# Patient Record
Sex: Male | Born: 1950 | Race: White | Hispanic: No | Marital: Married
Health system: Southern US, Community
[De-identification: ages and names within clinical notes are randomized; demographics above are authoritative.]

---

## 1998-08-03 ENCOUNTER — Ambulatory Visit (HOSPITAL_BASED_OUTPATIENT_CLINIC_OR_DEPARTMENT_OTHER): Admission: RE | Admit: 1998-08-03 | Discharge: 1998-08-03 | Payer: Self-pay | Admitting: Surgery

## 2000-11-20 ENCOUNTER — Ambulatory Visit (HOSPITAL_COMMUNITY): Admission: RE | Admit: 2000-11-20 | Discharge: 2000-11-20 | Payer: Self-pay | Admitting: Gastroenterology

## 2001-10-01 ENCOUNTER — Encounter (INDEPENDENT_AMBULATORY_CARE_PROVIDER_SITE_OTHER): Payer: Self-pay | Admitting: Specialist

## 2001-10-01 ENCOUNTER — Ambulatory Visit (HOSPITAL_BASED_OUTPATIENT_CLINIC_OR_DEPARTMENT_OTHER): Admission: RE | Admit: 2001-10-01 | Discharge: 2001-10-02 | Payer: Self-pay | Admitting: Surgery

## 2009-03-11 ENCOUNTER — Emergency Department (HOSPITAL_BASED_OUTPATIENT_CLINIC_OR_DEPARTMENT_OTHER): Admission: EM | Admit: 2009-03-11 | Discharge: 2009-03-11 | Payer: Self-pay | Admitting: Emergency Medicine

## 2009-03-11 ENCOUNTER — Ambulatory Visit: Payer: Self-pay | Admitting: Diagnostic Radiology

## 2010-04-05 LAB — CBC
Hemoglobin: 14 g/dL (ref 13.0–17.0)
MCHC: 34.8 g/dL (ref 30.0–36.0)
RDW: 13.2 % (ref 11.5–15.5)
WBC: 15 10*3/uL — ABNORMAL HIGH (ref 4.0–10.5)

## 2010-04-05 LAB — DIFFERENTIAL
Eosinophils Absolute: 0.1 10*3/uL (ref 0.0–0.7)
Eosinophils Relative: 1 % (ref 0–5)
Monocytes Absolute: 1.6 10*3/uL — ABNORMAL HIGH (ref 0.1–1.0)
Neutro Abs: 12 10*3/uL — ABNORMAL HIGH (ref 1.7–7.7)
Neutrophils Relative %: 80 % — ABNORMAL HIGH (ref 43–77)

## 2010-04-05 LAB — BASIC METABOLIC PANEL
BUN: 6 mg/dL (ref 6–23)
CO2: 26 mEq/L (ref 19–32)
Calcium: 8.9 mg/dL (ref 8.4–10.5)

## 2010-05-28 NOTE — Procedures (Signed)
[facility]  Patient:    Tyler Lynch, Tyler Lynch Visit Number: [id number] MRN: [id number]          Service Type: END Location: ENDO Attending Physician:  Louie Bun Dictated by:   Everardo All Madilyn Fireman, M.D. Proc. Date: 11/20/00 Admit Date:  11/20/2000   CC:         Jethro Bastos, M.D.   Procedure Report  PROCEDURE PERFORMED:  Colonoscopy  INDICATION FOR PROCEDURE:  Intermittent rectal bleeding in a 60 year old patient with no prior colon screening.  DESCRIPTION OF PROCEDURE:  The patient was placed in the left lateral decubitus position and placed on the pulse monitor with continuous low flow oxygen delivered by nasal cannula.  He was sedated with 100 mg IV Demerol and 10 mg IV Versed.  The Olympus video colonoscope was inserted into the rectum and advanced to the cecum.  Confirmed by transillumination of McBurneys point and visualization of the ileocecal valve and appendiceal orifice.  The prep was good.  The cecum, ascending, transverse, descending and sigmoid colon all appeared normal with no masses, polyps, diverticula or other mucosal abnormalities.  The rectum likewise appeared normal on retroflexed view.  The anus did reveal some small internal hemorrhoids.  The colonoscope was then withdrawn and the patient returned to the recovery room in stable condition. He tolerated the procedure well and there were no immediate complications.  IMPRESSION: Internal hemorrhoids, otherwise normal colonoscopy. Dictated by:   Everardo All Madilyn Fireman, M.D. Attending Physician:  Louie Bun DD:  11/20/00 TD:  11/21/00 Job: [id number]

## 2010-05-28 NOTE — Op Note (Signed)
NAME:  Tyler Lynch, Tyler Lynch                          ACCOUNT NO.:  [id number]   MEDICAL RECORD NO.:  [id number]                   PATIENT TYPE:  AMB   LOCATION:  DSC                                  FACILITY:  [facility]   PHYSICIAN:  Thornton Park. Daphine Deutscher, M.D.             DATE OF BIRTH:  1950-02-15   DATE OF PROCEDURE:  10/01/2001  DATE OF DISCHARGE:                                 OPERATIVE REPORT   PREOPERATIVE DIAGNOSES:  Recurrent left inguinal hernia.   POSTOPERATIVE DIAGNOSES:  Left indirect inguinal hernia.   OPERATION PERFORMED:   SURGEON:  Matthew B. Daphine Deutscher, M.D.   ASSISTANT:  Sandria Bales. Ezzard Standing, M.D.   ANESTHESIA:  General endotracheal.   DESCRIPTION OF PROCEDURE:  The patient was taken to the operating room 8 and  given general anesthesia.  The abdomen was prepped with Betadine and draped  sterilely.  An oblique incision was made in the left inguinal region and  carried down through the subcutaneous tissues to the external oblique.  This  was identified and incised along the fibers.  I mobilized the cord  structures and proximally found a very large prominent indirect inguinal  hernia that occurred through a dilated ring mainly medial to the cord.  I  could see one of the tacks used to tack the mesh anteriorly.  I went ahead  and opened up the sac and teased it away from the cord structures and did a  high ligation using running 2-0 Prolene.   I then tucked that back down into the abdomen and grasped an edge of the  mesh and sutured it inferiorly to tighten the ring.  I put a second piece to  ensure that this was snug but not too tight.  I then cut a piece of mesh to  fit the floor and sutured it along the inguinal ligament and medially to the  internal oblique musculature.  Laterally, I allowed these to overlap and  then sutured them together with horizontal mattress sutures of 2-0 Prolene  which completely reinforced the internal ring and I felt it afforded a good  strong  closure.  I did however, allow adequate room for the cord structures.   This area was irrigated.  The external oblique was closed with running 2-0  Vicryl.  4-0 Vicryl was used in the subcutaneous tissues and then the skin  was closed with interrupted 5-0 Vicryl with benzoin and Steri-Strips.  The  patient tolerated the procedure well.  He will be kept at the [facility] for overnight observation.                                                 Thornton Park Daphine Deutscher, M.D.    MBM/MEDQ  D:  10/01/2001  T:  10/02/2001  Job:  [id number]   cc:   Jethro Bastos, M.D.  [address]  [place]  [place] [address]  Fax: [phone]

## 2011-07-11 IMAGING — CR DG HAND COMPLETE 3+V*L*
3 series · 3 of 3 positions shown · non-contrast
Comparison: None.

CLINICAL DATA: Red  streaks on arm.  Cat bite.

LEFT HAND - COMPLETE 3+ VIEW

[x hand pa left]
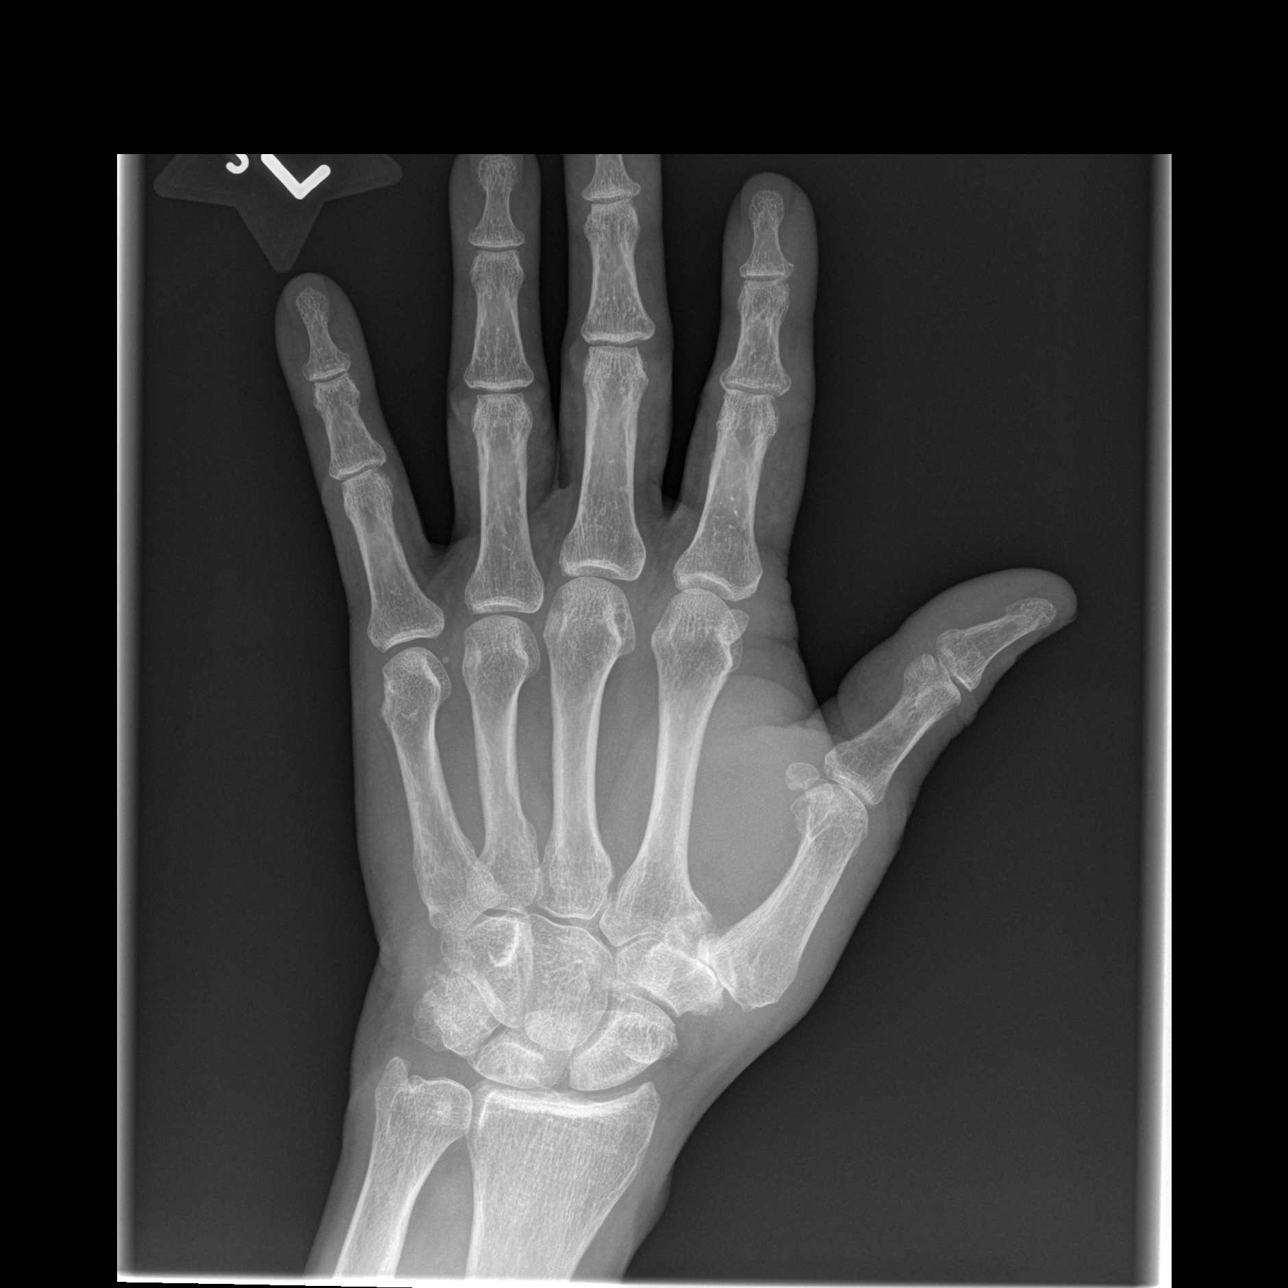

[x hand oblique left]
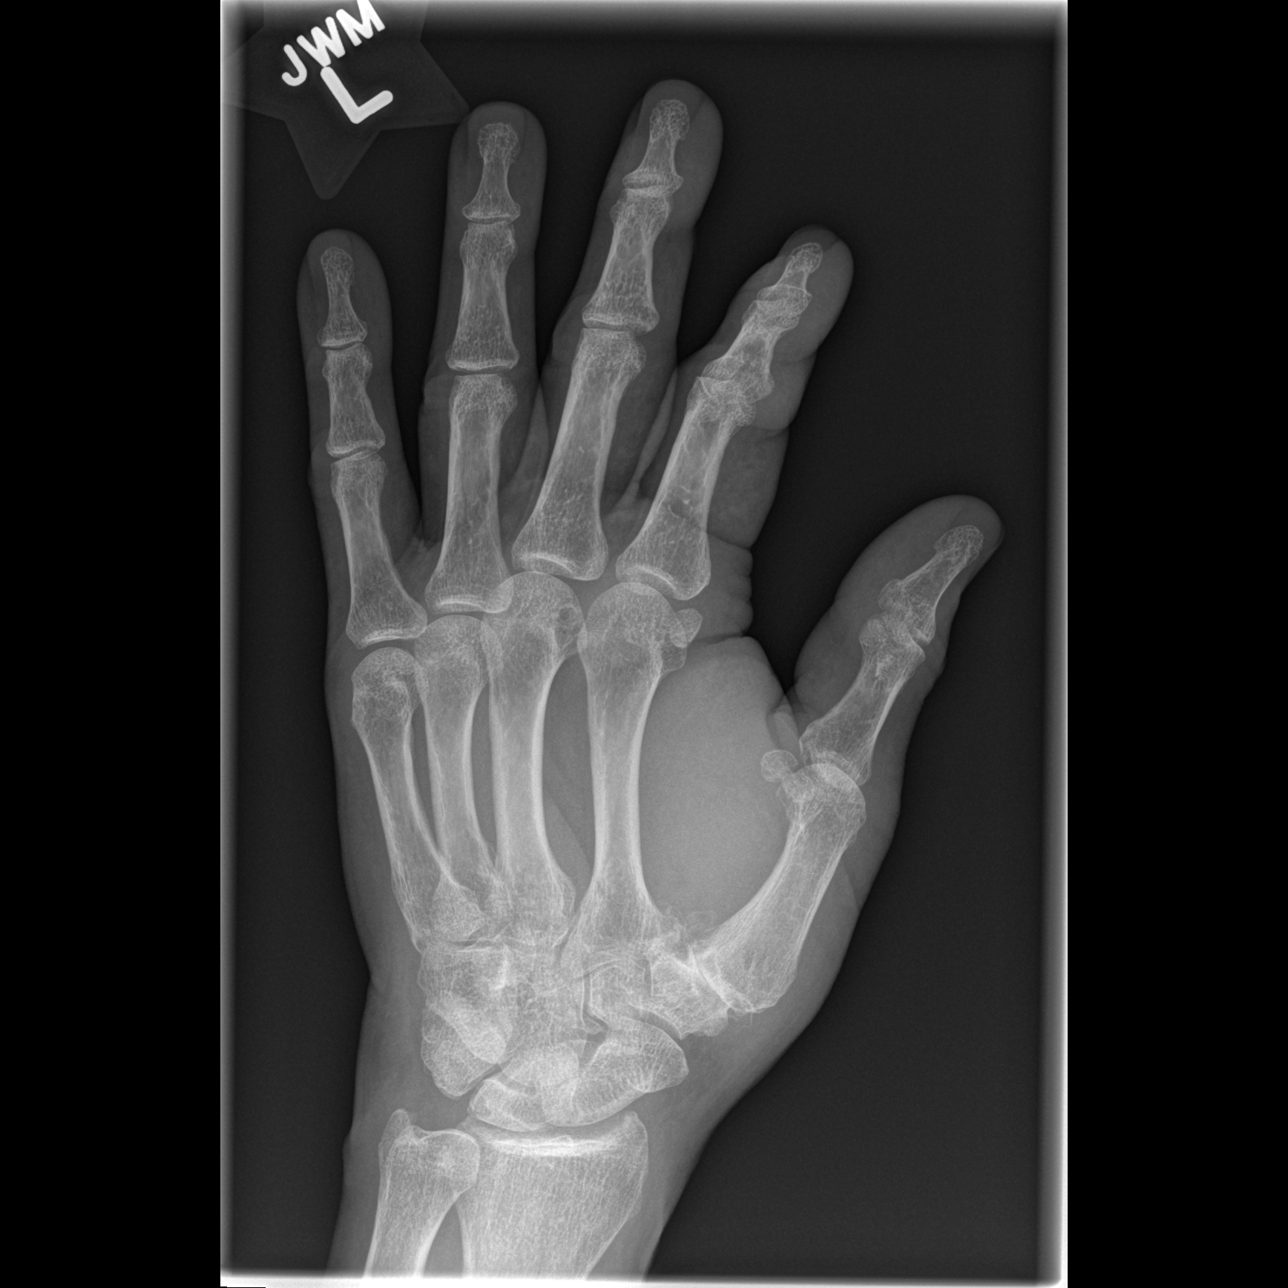

[x hand lat left]
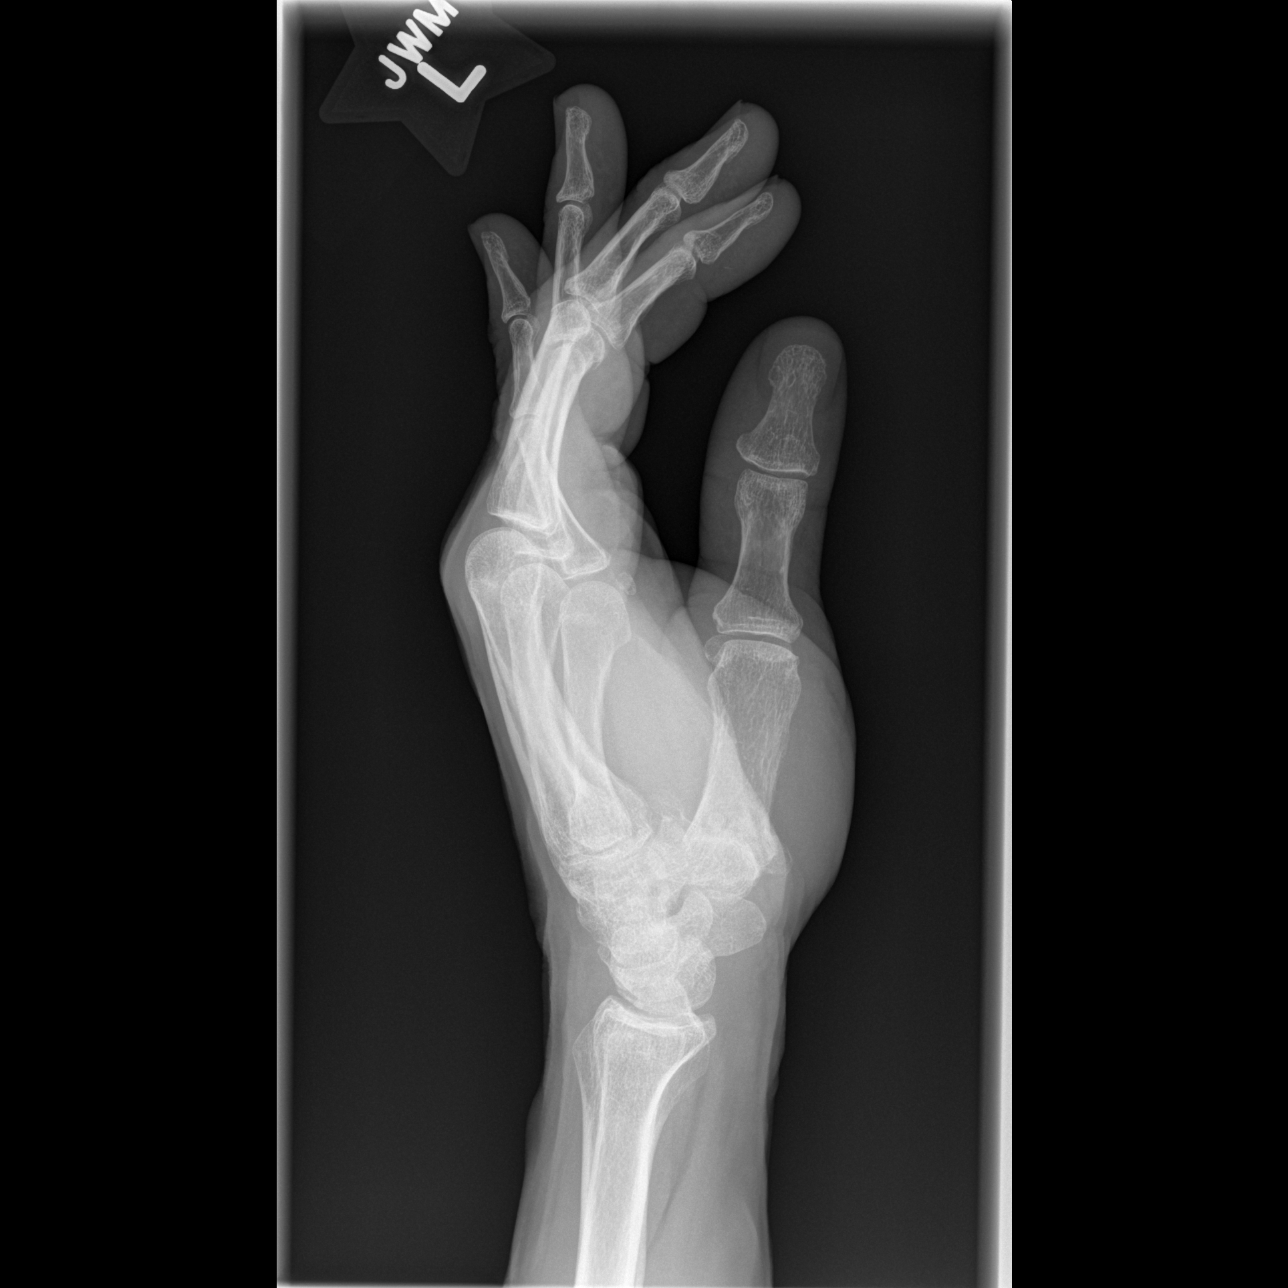

[3 of 3 positions shown; findings below may reference images not displayed]

FINDINGS: Negative for fracture.  There is degenerative change in
the first carpal metacarpal joint.  No erosions are identified.
There is no gas in the soft tissues.
IMPRESSION: No acute abnormality.  Degenerative change in the first carpal
metacarpal joint.

## 2014-09-16 DIAGNOSIS — H524 Presbyopia: Secondary | ICD-10-CM | POA: Diagnosis not present

## 2014-09-16 DIAGNOSIS — H2512 Age-related nuclear cataract, left eye: Secondary | ICD-10-CM | POA: Diagnosis not present

## 2014-09-16 DIAGNOSIS — H02839 Dermatochalasis of unspecified eye, unspecified eyelid: Secondary | ICD-10-CM | POA: Diagnosis not present

## 2014-09-16 DIAGNOSIS — H18413 Arcus senilis, bilateral: Secondary | ICD-10-CM | POA: Diagnosis not present

## 2014-09-16 DIAGNOSIS — H2513 Age-related nuclear cataract, bilateral: Secondary | ICD-10-CM | POA: Diagnosis not present

## 2014-11-10 DIAGNOSIS — H25812 Combined forms of age-related cataract, left eye: Secondary | ICD-10-CM | POA: Diagnosis not present

## 2014-11-10 DIAGNOSIS — H2512 Age-related nuclear cataract, left eye: Secondary | ICD-10-CM | POA: Diagnosis not present

## 2014-11-11 DIAGNOSIS — H2512 Age-related nuclear cataract, left eye: Secondary | ICD-10-CM | POA: Diagnosis not present

## 2014-11-24 DIAGNOSIS — H25811 Combined forms of age-related cataract, right eye: Secondary | ICD-10-CM | POA: Diagnosis not present

## 2014-11-24 DIAGNOSIS — H2511 Age-related nuclear cataract, right eye: Secondary | ICD-10-CM | POA: Diagnosis not present

## 2015-05-05 DIAGNOSIS — E7800 Pure hypercholesterolemia, unspecified: Secondary | ICD-10-CM | POA: Diagnosis not present

## 2015-05-05 DIAGNOSIS — Z125 Encounter for screening for malignant neoplasm of prostate: Secondary | ICD-10-CM | POA: Diagnosis not present

## 2015-05-05 DIAGNOSIS — Z1159 Encounter for screening for other viral diseases: Secondary | ICD-10-CM | POA: Diagnosis not present

## 2015-05-05 DIAGNOSIS — F325 Major depressive disorder, single episode, in full remission: Secondary | ICD-10-CM | POA: Diagnosis not present

## 2015-05-05 DIAGNOSIS — I10 Essential (primary) hypertension: Secondary | ICD-10-CM | POA: Diagnosis not present

## 2015-05-05 DIAGNOSIS — Z79899 Other long term (current) drug therapy: Secondary | ICD-10-CM | POA: Diagnosis not present

## 2015-05-05 DIAGNOSIS — Z23 Encounter for immunization: Secondary | ICD-10-CM | POA: Diagnosis not present

## 2015-05-05 DIAGNOSIS — F5221 Male erectile disorder: Secondary | ICD-10-CM | POA: Diagnosis not present

## 2015-05-05 DIAGNOSIS — J4520 Mild intermittent asthma, uncomplicated: Secondary | ICD-10-CM | POA: Diagnosis not present

## 2016-03-21 DIAGNOSIS — H26493 Other secondary cataract, bilateral: Secondary | ICD-10-CM | POA: Diagnosis not present

## 2016-03-28 DIAGNOSIS — H26492 Other secondary cataract, left eye: Secondary | ICD-10-CM | POA: Diagnosis not present

## 2016-03-28 DIAGNOSIS — H26491 Other secondary cataract, right eye: Secondary | ICD-10-CM | POA: Diagnosis not present

## 2016-03-29 DIAGNOSIS — H26492 Other secondary cataract, left eye: Secondary | ICD-10-CM | POA: Diagnosis not present

## 2016-12-29 DIAGNOSIS — F321 Major depressive disorder, single episode, moderate: Secondary | ICD-10-CM | POA: Diagnosis not present

## 2016-12-29 DIAGNOSIS — Z23 Encounter for immunization: Secondary | ICD-10-CM | POA: Diagnosis not present

## 2016-12-29 DIAGNOSIS — E7800 Pure hypercholesterolemia, unspecified: Secondary | ICD-10-CM | POA: Diagnosis not present

## 2016-12-29 DIAGNOSIS — I10 Essential (primary) hypertension: Secondary | ICD-10-CM | POA: Diagnosis not present

## 2016-12-29 DIAGNOSIS — J4520 Mild intermittent asthma, uncomplicated: Secondary | ICD-10-CM | POA: Diagnosis not present

## 2016-12-29 DIAGNOSIS — R0781 Pleurodynia: Secondary | ICD-10-CM | POA: Diagnosis not present

## 2016-12-29 DIAGNOSIS — Z125 Encounter for screening for malignant neoplasm of prostate: Secondary | ICD-10-CM | POA: Diagnosis not present

## 2017-09-12 DIAGNOSIS — E7800 Pure hypercholesterolemia, unspecified: Secondary | ICD-10-CM | POA: Diagnosis not present

## 2018-01-05 DIAGNOSIS — L819 Disorder of pigmentation, unspecified: Secondary | ICD-10-CM | POA: Diagnosis not present

## 2018-01-05 DIAGNOSIS — Z23 Encounter for immunization: Secondary | ICD-10-CM | POA: Diagnosis not present

## 2018-01-05 DIAGNOSIS — Z125 Encounter for screening for malignant neoplasm of prostate: Secondary | ICD-10-CM | POA: Diagnosis not present

## 2018-01-05 DIAGNOSIS — J4520 Mild intermittent asthma, uncomplicated: Secondary | ICD-10-CM | POA: Diagnosis not present

## 2018-01-05 DIAGNOSIS — Z0001 Encounter for general adult medical examination with abnormal findings: Secondary | ICD-10-CM | POA: Diagnosis not present

## 2018-01-05 DIAGNOSIS — E7800 Pure hypercholesterolemia, unspecified: Secondary | ICD-10-CM | POA: Diagnosis not present

## 2018-01-05 DIAGNOSIS — Z79899 Other long term (current) drug therapy: Secondary | ICD-10-CM | POA: Diagnosis not present

## 2018-01-05 DIAGNOSIS — M19049 Primary osteoarthritis, unspecified hand: Secondary | ICD-10-CM | POA: Diagnosis not present

## 2018-01-05 DIAGNOSIS — F321 Major depressive disorder, single episode, moderate: Secondary | ICD-10-CM | POA: Diagnosis not present

## 2018-01-05 DIAGNOSIS — I10 Essential (primary) hypertension: Secondary | ICD-10-CM | POA: Diagnosis not present

## 2018-01-05 DIAGNOSIS — H918X3 Other specified hearing loss, bilateral: Secondary | ICD-10-CM | POA: Diagnosis not present

## 2018-01-05 DIAGNOSIS — Z136 Encounter for screening for cardiovascular disorders: Secondary | ICD-10-CM | POA: Diagnosis not present

## 2019-04-01 ENCOUNTER — Ambulatory Visit: Payer: Self-pay

## 2019-04-02 ENCOUNTER — Ambulatory Visit: Payer: Medicare Other | Attending: Internal Medicine

## 2019-04-02 DIAGNOSIS — Z23 Encounter for immunization: Secondary | ICD-10-CM

## 2019-04-02 NOTE — Progress Notes (Signed)
   Covid-19 Vaccination Clinic  Name:  Tyler Lynch    MRN: [id number] DOB: [birth year]  04/02/2019  [name] was observed post Covid-19 immunization for 30 minutes based on pre-vaccination screening without incident. He was provided with Vaccine Information Sheet and instruction to access the V-Safe system.   [name] was instructed to call 911 with any severe reactions post vaccine: Marland Kitchen Difficulty breathing  . Swelling of face and throat  . A fast heartbeat  . A bad rash all over body  . Dizziness and weakness   Immunizations Administered    Name Date Dose VIS Date Route   Pfizer COVID-19 Vaccine 04/02/2019  3:23 PM 0.3 mL 12/21/2018 Intramuscular   Manufacturer: [employer], [employer]   Lot: [id number]   NDC: [address]-3

## 2019-04-24 ENCOUNTER — Ambulatory Visit: Payer: Medicare Other | Attending: Internal Medicine

## 2019-04-24 DIAGNOSIS — Z23 Encounter for immunization: Secondary | ICD-10-CM

## 2019-04-24 NOTE — Progress Notes (Signed)
   Covid-19 Vaccination Clinic  Name:  Tyler Lynch    MRN: [id number] DOB: 10-02-1950  04/24/2019  Mr. Kratochvil was observed post Covid-19 immunization for 15 minutes without incident. He was provided with Vaccine Information Sheet and instruction to access the V-Safe system.   Mr. Gadson was instructed to call 911 with any severe reactions post vaccine: Marland Kitchen Difficulty breathing  . Swelling of face and throat  . A fast heartbeat  . A bad rash all over body  . Dizziness and weakness   Immunizations Administered    Name Date Dose VIS Date Route   Pfizer COVID-19 Vaccine 04/24/2019  3:39 PM 0.3 mL 12/21/2018 Intramuscular   Manufacturer: [employer], [employer]   Lot: [id number]   NDC: [address]-2

## 2020-12-12 DIAGNOSIS — R4020 Unspecified coma: Secondary | ICD-10-CM | POA: Diagnosis not present

## 2020-12-12 DIAGNOSIS — R059 Cough, unspecified: Secondary | ICD-10-CM | POA: Diagnosis not present

## 2021-01-10 DIAGNOSIS — 419620001 Death: Secondary | SNOMED CT | POA: Diagnosis not present

## 2021-01-10 DEATH — deceased
